# Patient Record
Sex: Male | Born: 1970 | Race: White | Hispanic: No | Marital: Single | Smoking: Current every day smoker
Health system: Southern US, Community
[De-identification: ages and names within clinical notes are randomized; demographics above are authoritative.]

## PROBLEM LIST (undated history)

## (undated) DIAGNOSIS — E079 Disorder of thyroid, unspecified: Secondary | ICD-10-CM

## (undated) HISTORY — PX: KNEE SURGERY: SHX244

---

## 1999-11-29 ENCOUNTER — Emergency Department (HOSPITAL_COMMUNITY): Admission: EM | Admit: 1999-11-29 | Discharge: 1999-11-29 | Payer: Self-pay

## 2003-02-25 ENCOUNTER — Encounter: Admission: RE | Admit: 2003-02-25 | Discharge: 2003-02-25 | Payer: Self-pay | Admitting: Internal Medicine

## 2009-10-05 ENCOUNTER — Emergency Department (HOSPITAL_COMMUNITY): Admission: EM | Admit: 2009-10-05 | Discharge: 2009-10-05 | Payer: Self-pay | Admitting: Emergency Medicine

## 2010-04-24 LAB — POCT I-STAT, CHEM 8
BUN: 13 mg/dL (ref 6–23)
Calcium, Ion: 1.19 mmol/L (ref 1.12–1.32)
Chloride: 102 mEq/L (ref 96–112)
Creatinine, Ser: 0.9 mg/dL (ref 0.4–1.5)
Glucose, Bld: 122 mg/dL — ABNORMAL HIGH (ref 70–99)
HCT: 43 % (ref 39.0–52.0)
Hemoglobin: 14.6 g/dL (ref 13.0–17.0)
Potassium: 4.4 mEq/L (ref 3.5–5.1)
Sodium: 139 mEq/L (ref 135–145)
TCO2: 30 mmol/L (ref 0–100)

## 2010-04-24 LAB — POCT CARDIAC MARKERS
CKMB, poc: <1 ng/mL — ABNORMAL LOW (ref 1.0–8.0)
Myoglobin, poc: 58.9 ng/mL (ref 12–200)
Troponin i, poc: <0.05 ng/mL (ref 0.00–0.09)

## 2010-04-24 LAB — D-DIMER, QUANTITATIVE: D-Dimer, Quant: <0.22 ug/mL-FEU (ref 0.00–0.48)

## 2014-09-06 ENCOUNTER — Emergency Department (HOSPITAL_COMMUNITY)
Admission: EM | Admit: 2014-09-06 | Discharge: 2014-09-06 | Disposition: A | Discharge status: Home / Self Care | Payer: BLUE CROSS/BLUE SHIELD | Attending: Emergency Medicine | Admitting: Emergency Medicine

## 2014-09-06 ENCOUNTER — Encounter (HOSPITAL_COMMUNITY): Payer: Self-pay

## 2014-09-06 DIAGNOSIS — H109 Unspecified conjunctivitis: Secondary | ICD-10-CM

## 2014-09-06 DIAGNOSIS — Z7982 Long term (current) use of aspirin: Secondary | ICD-10-CM | POA: Insufficient documentation

## 2014-09-06 DIAGNOSIS — H5712 Ocular pain, left eye: Secondary | ICD-10-CM | POA: Diagnosis present

## 2014-09-06 DIAGNOSIS — E079 Disorder of thyroid, unspecified: Secondary | ICD-10-CM | POA: Insufficient documentation

## 2014-09-06 DIAGNOSIS — Z720 Tobacco use: Secondary | ICD-10-CM | POA: Diagnosis not present

## 2014-09-06 HISTORY — DX: Disorder of thyroid, unspecified: E07.9

## 2014-09-06 MED ORDER — TETRACAINE HCL 0.5 % OP SOLN
1.0000 [drp] | Freq: Once | OPHTHALMIC | Status: AC
  Administered 2014-09-06: 1 [drp] via OPHTHALMIC
  Filled 2014-09-06: qty 2

## 2014-09-06 MED ORDER — ERYTHROMYCIN 5 MG/GM OP OINT: TOPICAL_OINTMENT | OPHTHALMIC | Status: AC

## 2014-09-06 MED ORDER — FLUORESCEIN SODIUM 1 MG OP STRP
1.0000 | ORAL_STRIP | Freq: Once | OPHTHALMIC | Status: AC
  Administered 2014-09-06: 1 via OPHTHALMIC
  Filled 2014-09-06: qty 1

## 2014-09-06 NOTE — ED Notes (Signed)
Pt mowing lawn on Sunday.  Felt possible object into eye.  Went to MD Monday and told he had a small abrasion to eye.  Pt given drops and patch on Monday.  Pt went back to MD on Wednesday and told it looked well.  Pt eye continues to water.  Pt states today it feels like it did on Monday prior to seeing MD first time.  Flushing eye with saline.  Pt called office but MD was not in today.  Office unable to get pt in to an eye doctor.  Told to come here.

## 2014-09-06 NOTE — Discharge Instructions (Signed)

## 2014-09-06 NOTE — ED Provider Notes (Signed)
CSN: [id number]     Arrival date & time 09/06/14  1542 History  This chart was scribed for non-physician practitioner, Haynes Dage, PA-C, working with Zadie Rhine, MD, by Budd Palmer ED Scribe. This patient was seen in room WTR6/WTR6 and the patient's care was started at 4:44 PM    Chief Complaint  Patient presents with  . Eye Pain   The history is provided by the patient. No language interpreter was used.   HPI Comments: Darren Glover is a 44 y.o. male who presents to the Emergency Department complaining of worsening, constant left eye pain onset 6 days ago. He reports associated irritation, increased discharge, photophobia, as well as blurry vision of the left eye. He states that on the morning after onset the eye was crusty, but since then he states the eye feels dry and burning when he wakes up in the mornings. Pt has seen his PCP 5 days ago, where he was diagnosed with a corneal abrasion. He was given 1 dose of antibiotics in office and an eye patch. He noted improvement of the pain, but states the eye has been constantly "watering" since then. He went to follow up with the PCP 3 days ago, and was told the eye looked good. Today he says the pain had worsened to the severity experienced at onset. He denies any sick contacts. Pt works in [employer], sports.he does not wear glasses or contacts.  Past Medical History  Diagnosis Date  . Thyroid disease    Past Surgical History  Procedure Laterality Date  . Knee surgery     History reviewed. No pertinent family history. History  Substance Use Topics  . Smoking status: Current Every Day Smoker  . Smokeless tobacco: Not on file  . Alcohol Use: Yes     Comment: occasional    Review of Systems  Eyes: Positive for photophobia, pain, discharge and visual disturbance.    Allergies  Review of patient's allergies indicates no known allergies.  Home Medications   Prior to Admission medications   Medication Sig Start Date  End Date Taking? Authorizing Provider  aspirin 81 MG tablet Take 81 mg by mouth daily.   Yes Historical Provider, MD  ibuprofen (ADVIL,MOTRIN) 200 MG tablet Take 400 mg by mouth every 6 (six) hours as needed for headache.   Yes Historical Provider, MD  levothyroxine (SYNTHROID, LEVOTHROID) 112 MCG tablet Take 112 mcg by mouth daily before breakfast.  08/26/14  Yes Historical Provider, MD  pseudoephedrine (SUDAFED) 30 MG tablet Take 30 mg by mouth every 4 (four) hours as needed for congestion.   Yes Historical Provider, MD  erythromycin ophthalmic ointment Place a 1/2 inch ribbon of ointment into the lower eyelid. 09/06/14   Tynisa Vohs Patel-Mills, PA-C   BP 151/80 mmHg  Pulse 80  Temp(Src) 98.7 F (37.1 C) (Oral)  Resp 16  SpO2 99% Physical Exam  Constitutional: He is oriented to person, place, and time. He appears well-developed and well-nourished. No distress.  HENT:  Head: Normocephalic and atraumatic.  Mouth/Throat: Oropharynx is clear and moist.  Eyes: EOM and lids are normal. Pupils are equal, round, and reactive to light. Lids are everted and swept, no foreign bodies found. Right eye exhibits no discharge. Left eye exhibits no discharge. Right conjunctiva is not injected. Right conjunctiva has no hemorrhage. Left conjunctiva has no hemorrhage.  Fluorescein Stain performed. No stain uptake noted. No corneal abrasion. No Seidel Sign. No eyelid or periorbital cellulitis. No hyphema. No foreign body seen.  Left conjunctiva is erythematous.  Neck: Normal range of motion. Neck supple. No tracheal deviation present.  Cardiovascular: Normal rate.   Pulmonary/Chest: Breath sounds normal. No respiratory distress.  Abdominal: Soft.  Musculoskeletal: Normal range of motion.  Neurological: He is alert and oriented to person, place, and time.  Skin: Skin is warm and dry.  Psychiatric: He has a normal mood and affect. His behavior is normal.  Nursing note and vitals reviewed.   ED Course   Procedures  DIAGNOSTIC STUDIES: Oxygen Saturation is 99% on RA, normal by my interpretation.    COORDINATION OF CARE: 5:06 PM - Discussed plans to order antibiotics. Applied fluorescein stain. Advised to follow up with an optometrist. Advised to return to ED if he experiences pain behind the eye and/or swelling. Pt advised of plan for treatment and pt agrees.  Labs Review Labs Reviewed - No data to display  Imaging Review No results found.   EKG Interpretation None      MDM   Final diagnoses:  Conjunctivitis of left eye  Patient presents of left eye redness for 1 week.  Visual acuity:Bilateral 20/15, right 20/15, left 20/40 after drops. He states he feels like something is in the eye. Fluorescein stain was normal. I gave the patient referral to ophthalmology. I also placed the patient on erythromycin anabiotic in case this was conjunctivitis.  Medications  fluorescein ophthalmic strip 1 strip (1 strip Left Eye Given by Other 09/06/14 1653)  tetracaine (PONTOCAINE) 0.5 % ophthalmic solution 1 drop (1 drop Left Eye Given by Other 09/06/14 1657)  I do not believe this is related to glaucoma or orbital cellulitis. I personally performed the services described in this documentation, which was scribed in my presence. The recorded information has been reviewed and is accurate.   Catha Gosselin, PA-C 09/06/14 1857  Zadie Rhine, MD 09/06/14 [phone]

## 2016-12-28 ENCOUNTER — Other Ambulatory Visit: Payer: Self-pay | Admitting: Orthopedic Surgery

## 2016-12-28 DIAGNOSIS — M545 Low back pain: Secondary | ICD-10-CM

## 2017-01-08 ENCOUNTER — Ambulatory Visit
Admission: RE | Admit: 2017-01-08 | Discharge: 2017-01-08 | Disposition: A | Discharge status: Home / Self Care | Payer: BLUE CROSS/BLUE SHIELD | Source: Ambulatory Visit | Attending: Orthopedic Surgery | Admitting: Orthopedic Surgery

## 2017-01-08 DIAGNOSIS — M545 Low back pain: Secondary | ICD-10-CM

## 2017-12-27 ENCOUNTER — Other Ambulatory Visit: Payer: Self-pay | Admitting: Family Medicine

## 2017-12-27 DIAGNOSIS — N5089 Other specified disorders of the male genital organs: Secondary | ICD-10-CM

## 2017-12-28 ENCOUNTER — Ambulatory Visit
Admission: RE | Admit: 2017-12-28 | Discharge: 2017-12-28 | Disposition: A | Discharge status: Home / Self Care | Payer: Managed Care, Other (non HMO) | Source: Ambulatory Visit | Attending: Family Medicine | Admitting: Family Medicine

## 2017-12-28 DIAGNOSIS — N5089 Other specified disorders of the male genital organs: Secondary | ICD-10-CM

## 2018-07-19 IMAGING — MR MR LUMBAR SPINE W/O CM
4 of 5 series · 27 of 48 positions shown · non-contrast
Comparison: None.

CLINICAL DATA: Initial evaluation for low back pain with bilateral
sciatic type pain.

EXAM:
MRI LUMBAR SPINE WITHOUT CONTRAST
TECHNIQUE: Multiplanar, multisequence MR imaging of the lumbar spine was
performed. No intravenous contrast was administered.

[Series 3: T2 · sagittal · 4.0mm · 0.59mm/px · 4 of 17 slices shown (1 of 2)]
[im 1/17]
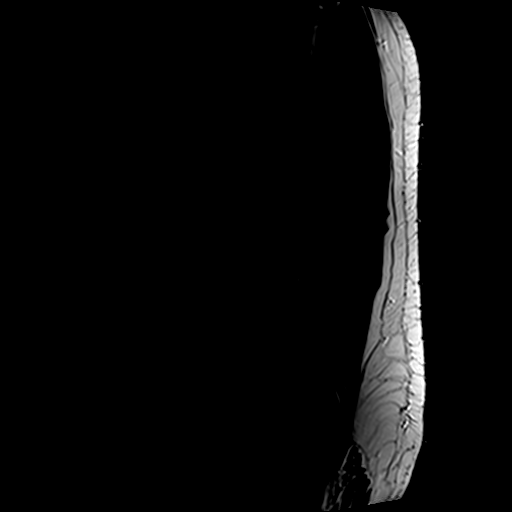
[im 6/17]
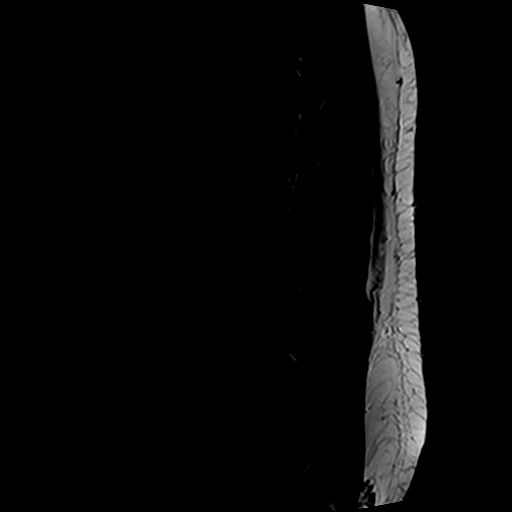
[im 11/17]
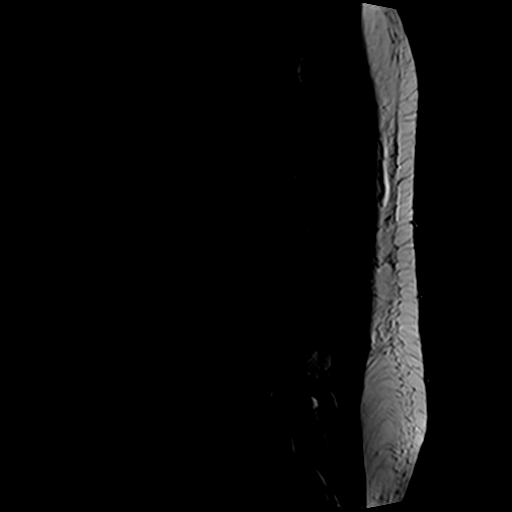
[im 17/17]
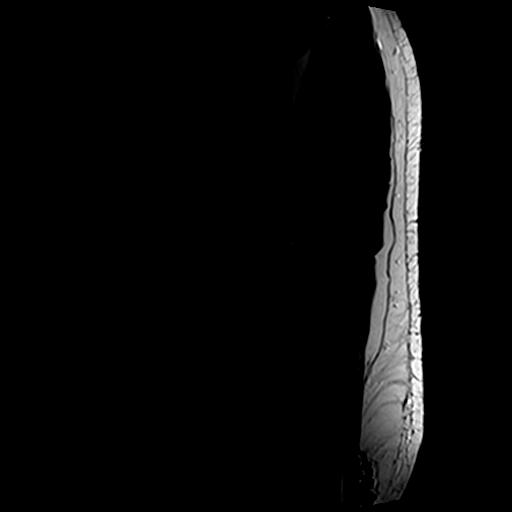

[Series 4: T1 · sagittal · 4.0mm · 0.59mm/px · 5 of 17 slices shown (1 of 2)]
[im 1/17]
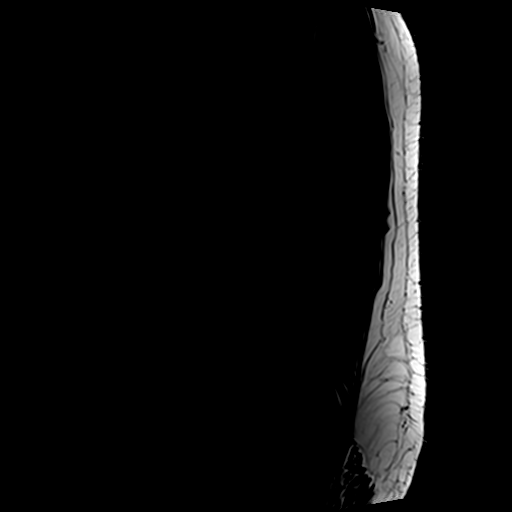
[im 5/17]
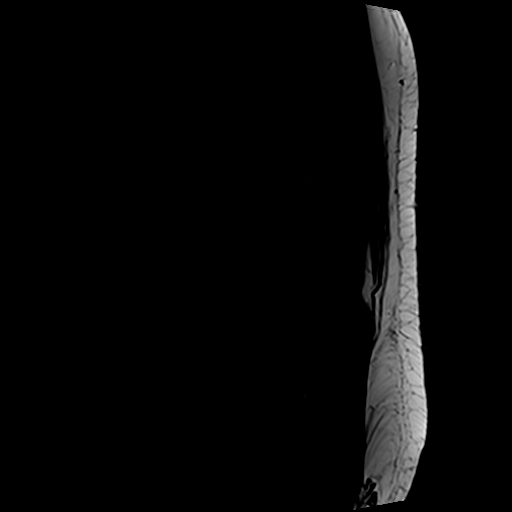
[im 9/17]
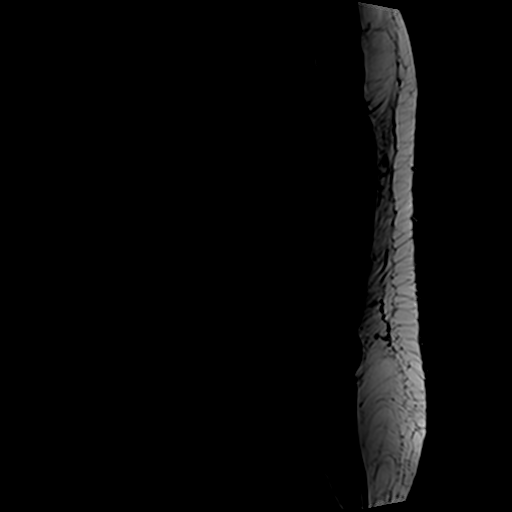
[im 13/17]
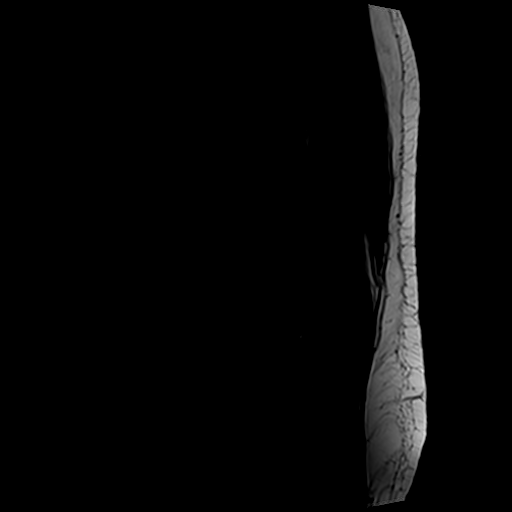
[im 17/17]
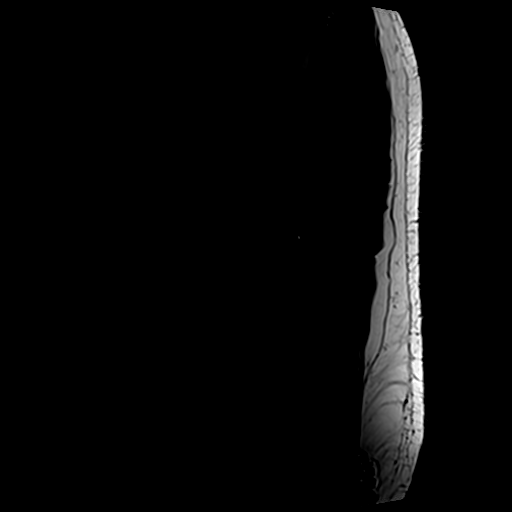

[Series 6: T2 · axial · 4.0mm · 0.70mm/px · z∈[-199,+61]mm · 11 of 58 slices shown (2 of 2)]
[im 4/58]
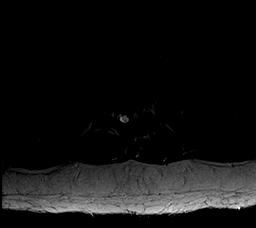
[im 8/58]
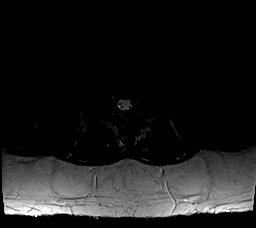
[im 11/58]
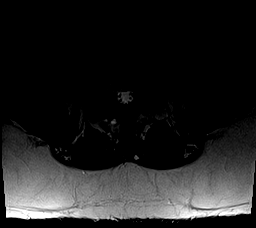
[im 18/58]
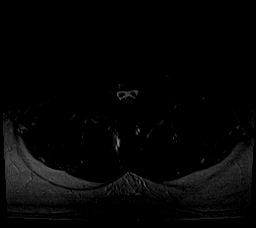
[im 25/58]
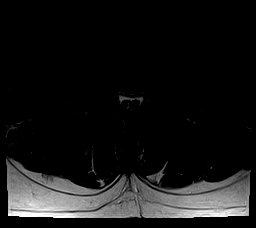
[im 29/58]
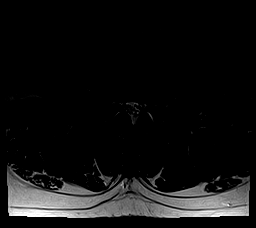
[im 33/58]
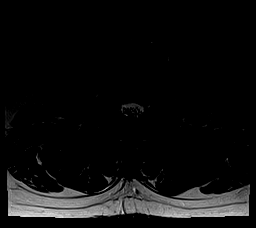
[im 40/58]
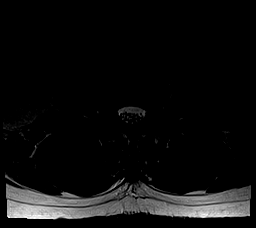
[im 47/58]
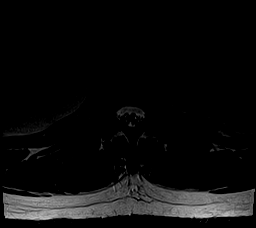
[im 50/58]
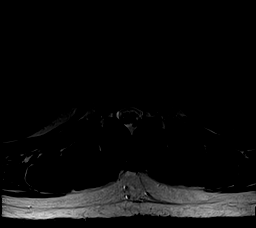
[im 54/58]
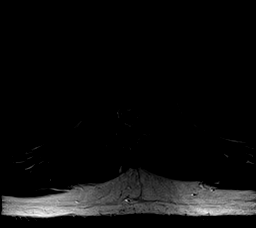

[Series 7: T1 · axial · 4.0mm · 0.35mm/px · z∈[-199,+41]mm · 7 of 58 slices shown (2 of 2)]
[im 4/58]
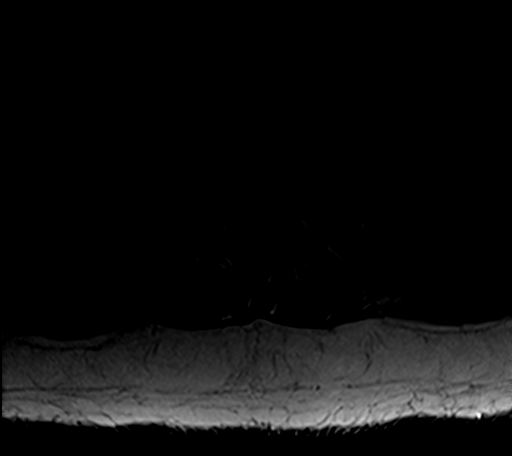
[im 8/58]
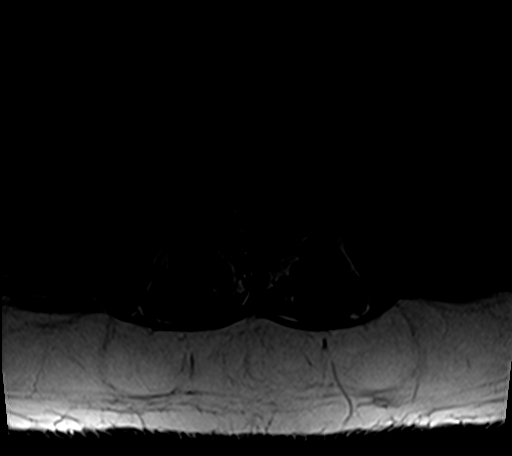
[im 11/58]
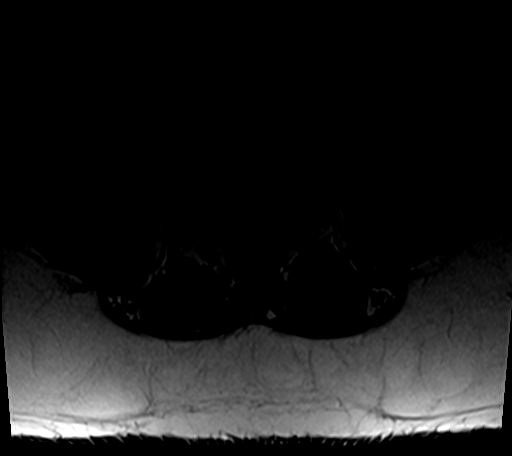
[im 18/58]
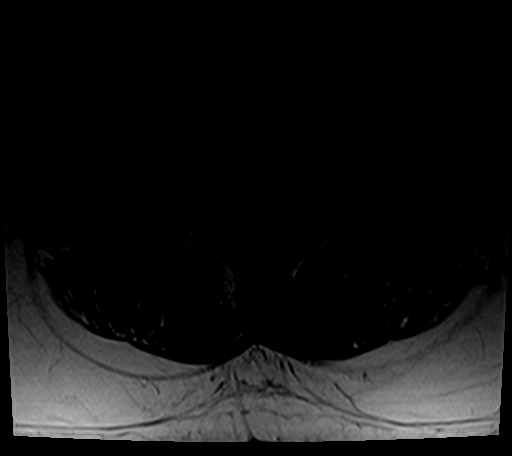
[im 25/58]
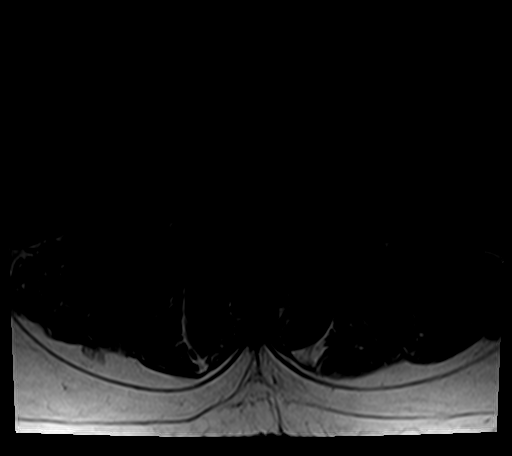
[im 29/58]
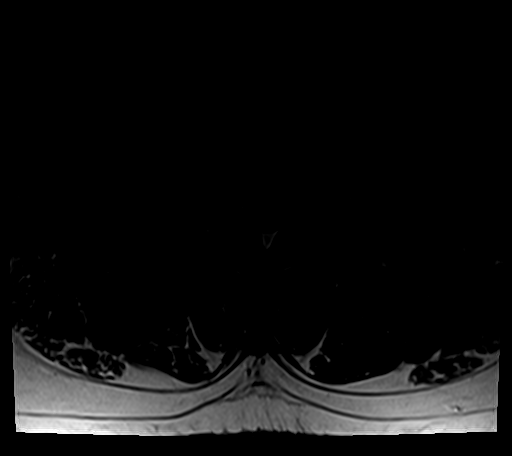
[im 50/58]
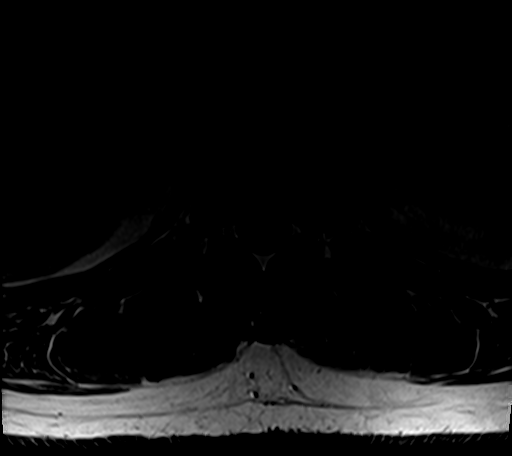

[27 of 48 positions shown; findings below may reference images not displayed]

FINDINGS: Segmentation: Transitional lumbosacral anatomy with partial
sacralization of the L5 vertebral body. Lowest well-formed disc
labeled L5-S1.

Alignment: Straightening of the normal lumbar lordosis. No
listhesis.

Vertebrae: Vertebral body heights are maintained. No evidence for
acute or chronic fracture. Bone marrow signal intensity within
normal limits. No discrete or worrisome osseous lesions.

Conus medullaris and cauda equina: Conus extends to the T12 level.
Conus and cauda equina appear normal.

Paraspinal and other soft tissues: Paraspinous soft tissues within
normal limits. Visualized visceral structures are normal.

Disc levels:

Diffuse congenital shortening of pedicles with resultant congenital
spinal stenosis.

L1-2: Diffuse disc bulge with disc desiccation and intervertebral
disc space narrowing. Disc bulging slightly eccentric to the left
without focal disc protrusion. No significant stenosis.

L2-3: Diffuse disc bulge with disc desiccation and intervertebral
disc space narrowing. Superimposed broad left
foraminal/extraforaminal disc protrusion, contacting and potentially
irritating the exiting left L2 nerve root (series 6, image 30). No
significant canal stenosis. Mild left foraminal narrowing.

L3-4: Diffuse disc bulge with disc desiccation and intervertebral
disc space narrowing. Superimposed central disc protrusion with
slight cephalad angulation (Series 6, image 37). Moderate facet and
ligament flavum hypertrophy. Changes superimposed on short pedicles
result in severe canal and bilateral subarticular stenosis. Mild to
moderate left L3 foraminal narrowing.

L4-5: Diffuse disc bulge with disc desiccation. Small posterior
annular fissure. Moderate facet and ligament flavum hypertrophy.
Resultant mild canal with bilateral subarticular stenosis. No
significant foraminal encroachment.

L5-S1: Mild to moderate facet arthrosis, right greater than left. No
stenosis or impingement.
IMPRESSION: 1. Multifactorial degenerative changes at L3-4 with resultant severe
canal and bilateral subarticular stenosis.
2. Broad left foraminal/extraforaminal disc protrusion at L2-3,
contacting and potentially irritating the exiting left L2 nerve
root.
3. Moderate facet arthropathy at L3-4 through L5-S1.
4. Underlying congenital spinal stenosis.
